# Patient Record
Sex: Male | Born: 1960 | Race: Black or African American | Hispanic: No | Marital: Married | Smoking: Never smoker
Health system: Southern US, Community
[De-identification: ages and names within clinical notes are randomized; demographics above are authoritative.]

## PROBLEM LIST (undated history)

## (undated) HISTORY — PX: TONSILLECTOMY: SUR1361

---

## 1998-12-23 ENCOUNTER — Ambulatory Visit: Admission: RE | Admit: 1998-12-23 | Discharge: 1998-12-23 | Payer: Self-pay | Admitting: Unknown Physician Specialty

## 2009-05-01 ENCOUNTER — Emergency Department (HOSPITAL_BASED_OUTPATIENT_CLINIC_OR_DEPARTMENT_OTHER): Admission: EM | Admit: 2009-05-01 | Discharge: 2009-05-01 | Payer: Self-pay | Admitting: Emergency Medicine

## 2017-06-23 ENCOUNTER — Encounter (HOSPITAL_BASED_OUTPATIENT_CLINIC_OR_DEPARTMENT_OTHER): Payer: Self-pay

## 2017-06-23 ENCOUNTER — Emergency Department (HOSPITAL_BASED_OUTPATIENT_CLINIC_OR_DEPARTMENT_OTHER): Payer: Worker's Compensation

## 2017-06-23 DIAGNOSIS — M25562 Pain in left knee: Secondary | ICD-10-CM | POA: Diagnosis present

## 2017-06-23 DIAGNOSIS — M79662 Pain in left lower leg: Secondary | ICD-10-CM | POA: Diagnosis not present

## 2017-06-23 NOTE — ED Notes (Signed)
Pt unsure if needs post accident [facility] UDS-notified he needed to get in touch with supervisor and let staff know

## 2017-06-23 NOTE — ED Triage Notes (Signed)
Pt states that a forced car door closed on left LE aprrox 930p at work-pt with ice packs and ace wrap in place upon arrivla-removed-slight abrasion and swelling noted to tib/fib area-pt NAD

## 2017-06-24 ENCOUNTER — Emergency Department (HOSPITAL_BASED_OUTPATIENT_CLINIC_OR_DEPARTMENT_OTHER)
Admission: EM | Admit: 2017-06-24 | Discharge: 2017-06-24 | Disposition: A | Discharge status: Home / Self Care | Payer: Worker's Compensation | Attending: Emergency Medicine | Admitting: Emergency Medicine

## 2017-06-24 DIAGNOSIS — M79662 Pain in left lower leg: Secondary | ICD-10-CM

## 2017-06-24 DIAGNOSIS — M25562 Pain in left knee: Secondary | ICD-10-CM

## 2017-06-24 MED ORDER — HYDROCODONE-ACETAMINOPHEN 5-325 MG PO TABS: 1.0000 | ORAL_TABLET | Freq: Four times a day (QID) | ORAL | 0 refills | Status: DC | PRN

## 2017-06-24 MED ORDER — IBUPROFEN 800 MG PO TABS: 800.0000 mg | ORAL_TABLET | Freq: Three times a day (TID) | ORAL | 0 refills | Status: DC

## 2017-06-24 NOTE — ED Provider Notes (Signed)
[facility] Provider Note   CSN: [id number] Arrival date & time: 06/23/17  2215     History   Chief Complaint Chief Complaint  Patient presents with  . Leg Injury    HPI Chad Cohen is a 56 y.o. male presenting with L leg pain.  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 He was unable to pull his leg out from between the truck and the door. He needed his coworkers help to get out. He states he is unable to walk due to the pain in his leg. Pain is in his knee medial and laterally, upper medial calf and lower outer leg. He has no pain in his hip, foot, or ankle. pain is worse with palpation and movement. Nothing makes it better. He denies previous injury to this leg. He denies numbness or tingling of his foot. He is not on blood thinners. He has not taken anything for pain.   Pt is unsure if he need UDS for paperwork/documentation for worker's comp.   HPI  History reviewed. No pertinent past medical history.  There are no active problems to display for this patient.   Past Surgical History:  Procedure Laterality Date  . TONSILLECTOMY         Home Medications    Prior to Admission medications   Medication Sig Start Date End Date Taking? Authorizing Provider  HYDROcodone-acetaminophen (NORCO/VICODIN) 5-325 MG tablet Take 1 tablet by mouth every 6 (six) hours as needed for severe pain. 06/24/17   Babara Buffalo, PA-C  ibuprofen (ADVIL,MOTRIN) 800 MG tablet Take 1 tablet (800 mg total) by mouth 3 (three) times daily. 06/24/17   Torsten Weniger, PA-C    Family History No family history on file.  Social History Social History  Substance Use Topics  . Smoking status: Never Smoker  . Smokeless tobacco: Never Used  . Alcohol use No     Allergies   Iodine   Review of Systems Review of Systems  Musculoskeletal: Positive for arthralgias, joint swelling and myalgias.  Neurological: Negative for  numbness.  Hematological: Does not bruise/bleed easily.     Physical Exam Updated Vital Signs BP 121/68 (BP Location: Right Arm)   Pulse 77   Temp 98.1 F (36.7 C) (Oral)   Resp 18   Ht 5\' 8"  (1.727 m)   Wt 118 kg (260 lb 2.3 oz)   SpO2 98%   BMI 39.55 kg/m   Physical Exam  Constitutional: He is oriented to person, place, and time. He appears well-developed and well-nourished. No distress.  HENT:  Head: Normocephalic and atraumatic.  Eyes: EOM are normal.  Neck: Normal range of motion.  Pulmonary/Chest: Effort normal.  Abdominal: He exhibits no distension.  Musculoskeletal: Normal range of motion.  Moderate swelling of L knee. No obvious contusions or laceration of knee. TTP of bilateral knee. No TTP posteriorly. Pt will not let me range the knee due to pain.  TTP of medial upper calf. No obvious laceration. Some small contusion noted. TTP of lower lateral leg with minor abrasion. No active bleeding. Minimal contusion in the area. Compartments of lower leg soft.  Pedal pulses intact bilaterally. Sensation intact bilaterally. Full active ROM of ankle and toes with minimal pain. Color and warmth of feet equal bilaterally.   Neurological: He is alert and oriented to person, place, and time.  Skin: Skin is warm. No rash noted.  Psychiatric: He has  a normal mood and affect.  Nursing note and vitals reviewed.    ED Treatments / Results  Labs (all labs ordered are listed, but only abnormal results are displayed) Labs Reviewed - No data to display  EKG  EKG Interpretation None       Radiology Dg Tibia/fibula Left  Result Date: 06/23/2017 CLINICAL DATA:  Left leg slammed in truck door. EXAM: LEFT TIBIA AND FIBULA - 2 VIEW COMPARISON:  None. FINDINGS: There is no evidence of fracture or other focal bone lesions. Soft tissues are unremarkable. IMPRESSION: No osseous injury of the left tibia or fibula. Electronically Signed   By: Deatra Robinson M.D.   On: 06/23/2017 23:00     Procedures Procedures (including critical care time)  Medications Ordered in ED Medications - No data to display   Initial Impression / Assessment and Plan / ED Course  I have reviewed the triage vital signs and the nursing notes.  Pertinent labs & imaging results that were available during my care of the patient were reviewed by me and considered in my medical decision making (see chart for details).     Pt presenting with pain of L knee and lower leg after it was caught in a truck door. Physical exam shows signs of muscle bruising and knee injury. Xray shows no signs of fx or dislocation. Likely muscle/ligament injury. Pt neurovascularly intact with soft compartments. Discussed findings with pt. Will dc with knee immobilizer, crutches, and pain medication. Pt to f/u with ortho. Pt appears safe for dc at this time. Strict return precautions given. Pt states he understands and agrees to plan.   Final Clinical Impressions(s) / ED Diagnoses   Final diagnoses:  Pain of left lower leg  Acute pain of left knee    New Prescriptions Discharge Medication List as of 06/24/2017  1:06 AM    START taking these medications   Details  HYDROcodone-acetaminophen (NORCO/VICODIN) 5-325 MG tablet Take 1 tablet by mouth every 6 (six) hours as needed for severe pain., Starting Thu 06/24/2017, Print    ibuprofen (ADVIL,MOTRIN) 800 MG tablet Take 1 tablet (800 mg total) by mouth 3 (three) times daily., Starting Thu 06/24/2017, Print         Brockway, Keilyn Nadal, PA-C 06/24/17 0330    Molpus, Jonny Ruiz, MD 06/24/17 [id number]

## 2017-06-24 NOTE — Discharge Instructions (Signed)
Take ibuprofen 3 times a day. You may take Norco as needed for breakthrough pain. Use the knee immobilizer and crutches while walking. It is important that you follow up with orthopedic doctor for further evaluation of your knee and leg. Return to the emergency room if you develop significantly worsening pain, hardness of your leg, change in color of your foot, or any new or worsening symptoms.

## 2017-07-09 ENCOUNTER — Telehealth: Payer: Self-pay | Admitting: *Deleted

## 2017-07-09 NOTE — Telephone Encounter (Signed)
Received request for Medical Records for Workman's Comp claim, non-established, seen in ED 06/23/17; forwarded to [place] for email/scan/SLS

## 2018-05-17 ENCOUNTER — Emergency Department (HOSPITAL_BASED_OUTPATIENT_CLINIC_OR_DEPARTMENT_OTHER): Payer: Self-pay

## 2018-05-17 ENCOUNTER — Other Ambulatory Visit: Payer: Self-pay

## 2018-05-17 ENCOUNTER — Emergency Department (HOSPITAL_BASED_OUTPATIENT_CLINIC_OR_DEPARTMENT_OTHER)
Admission: EM | Admit: 2018-05-17 | Discharge: 2018-05-18 | Disposition: A | Discharge status: Home / Self Care | Payer: Self-pay | Attending: Emergency Medicine | Admitting: Emergency Medicine

## 2018-05-17 ENCOUNTER — Encounter (HOSPITAL_BASED_OUTPATIENT_CLINIC_OR_DEPARTMENT_OTHER): Payer: Self-pay | Admitting: *Deleted

## 2018-05-17 DIAGNOSIS — E860 Dehydration: Secondary | ICD-10-CM | POA: Insufficient documentation

## 2018-05-17 DIAGNOSIS — K760 Fatty (change of) liver, not elsewhere classified: Secondary | ICD-10-CM | POA: Insufficient documentation

## 2018-05-17 DIAGNOSIS — R1013 Epigastric pain: Secondary | ICD-10-CM | POA: Insufficient documentation

## 2018-05-17 DIAGNOSIS — R3915 Urgency of urination: Secondary | ICD-10-CM | POA: Insufficient documentation

## 2018-05-17 DIAGNOSIS — K5900 Constipation, unspecified: Secondary | ICD-10-CM | POA: Insufficient documentation

## 2018-05-17 LAB — COMPREHENSIVE METABOLIC PANEL
ALK PHOS: 64 U/L (ref 38–126)
ALT: 35 U/L (ref 0–44)
AST: 29 U/L (ref 15–41)
Albumin: 3.6 g/dL (ref 3.5–5.0)
Anion gap: 8 (ref 5–15)
BUN: 13 mg/dL (ref 6–20)
CALCIUM: 8.8 mg/dL — AB (ref 8.9–10.3)
CO2: 26 mmol/L (ref 22–32)
CREATININE: 1.06 mg/dL (ref 0.61–1.24)
Chloride: 106 mmol/L (ref 98–111)
GFR calc Af Amer: >60 mL/min (ref >60)
GFR calc non Af Amer: >60 mL/min (ref >60)
Glucose, Bld: 97 mg/dL (ref 70–99)
Potassium: 3.9 mmol/L (ref 3.5–5.1)
Sodium: 140 mmol/L (ref 135–145)
Total Bilirubin: 0.7 mg/dL (ref 0.3–1.2)
Total Protein: 7 g/dL (ref 6.5–8.1)

## 2018-05-17 LAB — CBC WITH DIFFERENTIAL/PLATELET
BASOS ABS: 0 10*3/uL (ref 0.0–0.1)
Basophils Relative: 0 %
EOS PCT: 2 %
Eosinophils Absolute: 0.1 10*3/uL (ref 0.0–0.7)
HCT: 42.6 % (ref 39.0–52.0)
HEMOGLOBIN: 14.2 g/dL (ref 13.0–17.0)
LYMPHS ABS: 2.8 10*3/uL (ref 0.7–4.0)
Lymphocytes Relative: 44 %
MCH: 28.3 pg (ref 26.0–34.0)
MCHC: 33.3 g/dL (ref 30.0–36.0)
MCV: 85 fL (ref 78.0–100.0)
Monocytes Absolute: 0.7 10*3/uL (ref 0.1–1.0)
Monocytes Relative: 11 %
NEUTROS PCT: 43 %
Neutro Abs: 2.7 10*3/uL (ref 1.7–7.7)
PLATELETS: 249 10*3/uL (ref 150–400)
RBC: 5.01 MIL/uL (ref 4.22–5.81)
RDW: 14.7 % (ref 11.5–15.5)
WBC: 6.3 10*3/uL (ref 4.0–10.5)

## 2018-05-17 LAB — AMMONIA: Ammonia: 29 umol/L (ref 9–35)

## 2018-05-17 LAB — LIPASE, BLOOD: LIPASE: 41 U/L (ref 11–51)

## 2018-05-17 MED ORDER — ONDANSETRON HCL 4 MG/2ML IJ SOLN
4.0000 mg | Freq: Once | INTRAMUSCULAR | Status: AC
  Administered 2018-05-17: 4 mg via INTRAVENOUS
  Filled 2018-05-17: qty 2

## 2018-05-17 MED ORDER — MORPHINE SULFATE (PF) 4 MG/ML IV SOLN
4.0000 mg | Freq: Once | INTRAVENOUS | Status: AC
  Administered 2018-05-17: 4 mg via INTRAVENOUS
  Filled 2018-05-17: qty 1

## 2018-05-17 MED ORDER — SODIUM CHLORIDE 0.9 % IV BOLUS
1000.0000 mL | Freq: Once | INTRAVENOUS | Status: AC
  Administered 2018-05-17: 1000 mL via INTRAVENOUS

## 2018-05-17 MED ORDER — SODIUM CHLORIDE 0.9 % IV BOLUS
500.0000 mL | Freq: Once | INTRAVENOUS | Status: AC
  Administered 2018-05-17: 500 mL via INTRAVENOUS

## 2018-05-17 MED ORDER — POLYETHYLENE GLYCOL 3350 17 G PO PACK: 17.0000 g | PACK | Freq: Every day | ORAL | 0 refills | Status: DC

## 2018-05-17 NOTE — ED Triage Notes (Signed)
Right upper quadrant pain x 5 days. Pain is off and on. States he has been under a lot of stress. Headache.

## 2018-05-17 NOTE — Discharge Instructions (Addendum)
Your work-up today showed hepatic steatosis (fatty liver) but otherwise did not show evidence of obstruction or other concerning problem in your abdomen aside from the known constipation.  Please use the MiraLAX daily to help treat your constipation.  Please maintain hydration, and increase your fluid intake.  We suspect you are slightly dehydrated.  Please follow-up with a primary care physician for further management.  If any symptoms change or worsen, please return to the nearest emergency department.

## 2018-05-17 NOTE — ED Provider Notes (Signed)
[facility] EMERGENCY DEPARTMENT Provider Note   CSN: [id number] Arrival date & time: 05/17/18  2045     History   Chief Complaint Chief Complaint  Patient presents with  . Abdominal Pain    HPI Chad Cohen is a 57 y.o. male.  The history is provided by the patient, the spouse and medical records.  Abdominal Pain   This is a new problem. The current episode started more than 2 days ago. The problem occurs daily. The problem has not changed since onset.The pain is associated with eating. The pain is located in the RUQ and epigastric region. The quality of the pain is aching, cramping and sharp. The pain is at a severity of 8/10. The pain is moderate. Associated symptoms include nausea and constipation. Pertinent negatives include fever, diarrhea, vomiting, dysuria, frequency, hematuria and headaches. The symptoms are aggravated by palpation and eating. Nothing relieves the symptoms. Past workup does not include GI consult or surgery.    History reviewed. No pertinent past medical history.  There are no active problems to display for this patient.   Past Surgical History:  Procedure Laterality Date  . TONSILLECTOMY          Home Medications    Prior to Admission medications   Medication Sig Start Date End Date Taking? Authorizing Provider  HYDROcodone-acetaminophen (NORCO/VICODIN) 5-325 MG tablet Take 1 tablet by mouth every 6 (six) hours as needed for severe pain. 06/24/17   Caccavale, Sophia, PA-C  ibuprofen (ADVIL,MOTRIN) 800 MG tablet Take 1 tablet (800 mg total) by mouth 3 (three) times daily. 06/24/17   Caccavale, Sophia, PA-C    Family History No family history on file.  Social History Social History   Tobacco Use  . Smoking status: Never Smoker  . Smokeless tobacco: Never Used  Substance Use Topics  . Alcohol use: No  . Drug use: No     Allergies   Iodine   Review of Systems Review of Systems  Constitutional: Positive for fatigue.  Negative for chills, diaphoresis and fever.  HENT: Negative for congestion.   Respiratory: Negative for cough, chest tightness, shortness of breath and wheezing.   Cardiovascular: Negative for chest pain, palpitations and leg swelling.  Gastrointestinal: Positive for abdominal distention, abdominal pain, constipation and nausea. Negative for blood in stool, diarrhea and vomiting.  Genitourinary: Positive for urgency. Negative for decreased urine volume, dysuria, flank pain, frequency, hematuria, penile pain, penile swelling, scrotal swelling and testicular pain.  Musculoskeletal: Negative for back pain, neck pain and neck stiffness.  Skin: Negative for rash and wound.  Neurological: Positive for light-headedness. Negative for weakness, numbness and headaches.  Psychiatric/Behavioral: Negative for agitation.     Physical Exam Updated Vital Signs BP 125/86   Pulse 75   Temp 98.4 F (36.9 C) (Oral)   Resp 18   Ht 5' 8.5" (1.74 m)   Wt 117.9 kg (260 lb)   SpO2 98%   BMI 38.96 kg/m   Physical Exam  Constitutional: He is oriented to person, place, and time. He appears well-developed and well-nourished.  Non-toxic appearance. He does not appear ill. No distress.  HENT:  Head: Normocephalic and atraumatic.  Mouth/Throat: Oropharynx is clear and moist. No oropharyngeal exudate.  Eyes: Pupils are equal, round, and reactive to light. Conjunctivae and EOM are normal. No scleral icterus.  Neck: Neck supple.  Cardiovascular: Normal rate and regular rhythm.  No murmur heard. Pulmonary/Chest: Effort normal and breath sounds normal. No respiratory distress.  Abdominal: Soft.  Normal appearance and bowel sounds are normal. He exhibits no distension. There is tenderness in the right upper quadrant and epigastric area. There is no rigidity, no rebound, no guarding and no CVA tenderness.  Musculoskeletal: He exhibits no edema.  Neurological: He is alert and oriented to person, place, and time. He is  not disoriented. He displays no tremor. A sensory deficit (L leg numbness at baseline) is present. No cranial nerve deficit. He exhibits normal muscle tone. Coordination normal. GCS eye subscore is 4. GCS verbal subscore is 5. GCS motor subscore is 6.  Skin: Skin is warm and dry. Capillary refill takes less than 2 seconds. He is not diaphoretic. No erythema. No pallor.  Psychiatric: He has a normal mood and affect.  Nursing note and vitals reviewed.    ED Treatments / Results  Labs (all labs ordered are listed, but only abnormal results are displayed) Labs Reviewed  COMPREHENSIVE METABOLIC PANEL - Abnormal; Notable for the following components:      Result Value   Calcium 8.8 (*)    All other components within normal limits  URINE CULTURE  CBC WITH DIFFERENTIAL/PLATELET  LIPASE, BLOOD  AMMONIA  URINALYSIS, ROUTINE W REFLEX MICROSCOPIC    EKG None  Radiology Ct Abdomen Pelvis Wo Contrast  Result Date: 05/17/2018 CLINICAL DATA:  Periodic right upper quadrant abdominal pain over the last 5 days. Constipation and distention. EXAM: CT ABDOMEN AND PELVIS WITHOUT CONTRAST TECHNIQUE: Multidetector CT imaging of the abdomen and pelvis was performed following the standard protocol without IV contrast. COMPARISON:  None. FINDINGS: Lower chest: Unremarkable Hepatobiliary: Diffuse hepatic steatosis.  Gallbladder unremarkable. Pancreas: Unremarkable Spleen: Unremarkable Adrenals/Urinary Tract: Unremarkable Stomach/Bowel: Food material and contrast in the stomach. Appendix normal. Stool burden within normal limits. No dilated bowel. Vascular/Lymphatic: Unremarkable Reproductive: Unremarkable Other: No supplemental non-categorized findings. Musculoskeletal: Unremarkable IMPRESSION: 1. Diffuse hepatic steatosis. 2. Otherwise unremarkable. Electronically Signed   By: Gaylyn Rong M.D.   On: 05/17/2018 23:20    Procedures Procedures (including critical care time)  Medications Ordered in  ED Medications  sodium chloride 0.9 % bolus 1,000 mL (1,000 mLs Intravenous New Bag/Given 05/17/18 2343)  morphine 4 MG/ML injection 4 mg (4 mg Intravenous Given 05/17/18 2202)  ondansetron (ZOFRAN) injection 4 mg (4 mg Intravenous Given 05/17/18 2202)  sodium chloride 0.9 % bolus 500 mL (0 mLs Intravenous Stopped 05/17/18 2343)     Initial Impression / Assessment and Plan / ED Course  I have reviewed the triage vital signs and the nursing notes.  Pertinent labs & imaging results that were available during my care of the patient were reviewed by me and considered in my medical decision making (see chart for details).    Chad Cohen is a 57 y.o. male with a past medical history significant for prior tonsillectomy and prior left leg injury with chronic numbness who presents with fatigue, abdominal discomfort, constipation, nausea, and diffuse myalgias.  Patient reports that his symptoms have been ongoing for the last 5 days.  He reports the pain comes and goes and it is in the right upper quadrant primarily.  He describes it is cramping, sharp, and associated with distention.  He says that he had a bowel movement that was very small amount this morning and constipated.  He denies rectal bleeding.  He reports nausea but no vomiting.  He reports fatigue and has not been eating or drinking as much.  He reports his pain gets up to an 8 out of 10 severity and  last several minutes at a time.  He denies history of gallbladder disease or prior abdominal surgeries.  He does report increase in stress.  He denies any recent trauma, fevers, or chills.  He denies any new medication use.  He does report some urinary urgency but denies dysuria.  On exam, abdomen is tender in the right upper quadrant and the epigastrium.  No tenderness in the lower abdomen.  No flank tenderness or back tenderness.  Lungs clear chest is nontender.  Patient has some numbness and parts of his left leg which she reports is unchanged  from baseline.  No other focal neurologic abnormality seen.  Clinically I suspect patient has constipation causing his symptoms however with the right upper quadrant pain it, will obtain imaging to further evaluate for gallbladder disease.  With the distention and constipation, will order CT scan with oral contrast.  Patient given pain medicine, nausea medicine, and fluids initially.  Patient will have laboratory testing as well.  Patient reported feeling better after medications.  CT scan showed hepatic steatosis but otherwise reassuring.  No evidence of obstruction, diverticulitis or appendicitis.  No cholecystitis seen.  Laboratory testing was reassuring.  Patient is awaiting urinalysis however if it is reassuring I suspect patient will be stable for discharge home for further management of dehydration and likely constipation as the cause of symptoms.  Anticipate discharge with MiraLAX prescription.  12:15 AM Patient dropped his urine sample into the toilet.  Patient be given more fluids and urine will be collected again.  Care transferred to Dr. Read DriversMolpus while awaiting urinalysis results.  If urinalysis is positive, will treat for UTI.  If it is negative, patient was sent home with prescription for MiraLAX for his constipation.  Patient will be encouraged to stay hydrated and follow-up with PCP.  Final Clinical Impressions(s) / ED Diagnoses   Final diagnoses:  Epigastric pain  Constipation, unspecified constipation type  Dehydration  Urinary urgency  Hepatic steatosis    ED Discharge Orders        Ordered    polyethylene glycol (MIRALAX) packet  Daily     05/17/18 2359      Clinical Impression: 1. Epigastric pain   2. Constipation, unspecified constipation type   3. Dehydration   4. Urinary urgency   5. Hepatic steatosis     Disposition: Care transferred to Dr. Read DriversMolpus for reassessment of patient after urinalysis.  Anticipate discharge for outpatient management of constipation as  likely etiology of her abdominal pain.  This note was prepared with assistance of Conservation officer, historic buildingsDragon voice recognition software. Occasional wrong-word or sound-a-like substitutions may have occurred due to the inherent limitations of voice recognition software.     Tegeler, Canary Brimhristopher J, MD 05/18/18 1051

## 2018-05-18 LAB — URINALYSIS, ROUTINE W REFLEX MICROSCOPIC
BILIRUBIN URINE: NEGATIVE
Glucose, UA: NEGATIVE mg/dL
Ketones, ur: NEGATIVE mg/dL
LEUKOCYTES UA: NEGATIVE
NITRITE: NEGATIVE
Protein, ur: NEGATIVE mg/dL
Specific Gravity, Urine: 1.02 (ref 1.005–1.030)
pH: 5.5 (ref 5.0–8.0)

## 2018-05-18 LAB — URINALYSIS, MICROSCOPIC (REFLEX)

## 2018-05-19 LAB — URINE CULTURE: Culture: NO GROWTH

## 2018-05-21 IMAGING — DX DG TIBIA/FIBULA 2V*L*
4 series · 4 of 4 positions shown · non-contrast
Comparison: None.

CLINICAL DATA: Left leg slammed in truck door.

EXAM:
LEFT TIBIA AND FIBULA - 2 VIEW

[tibia ap (1 of 2)]
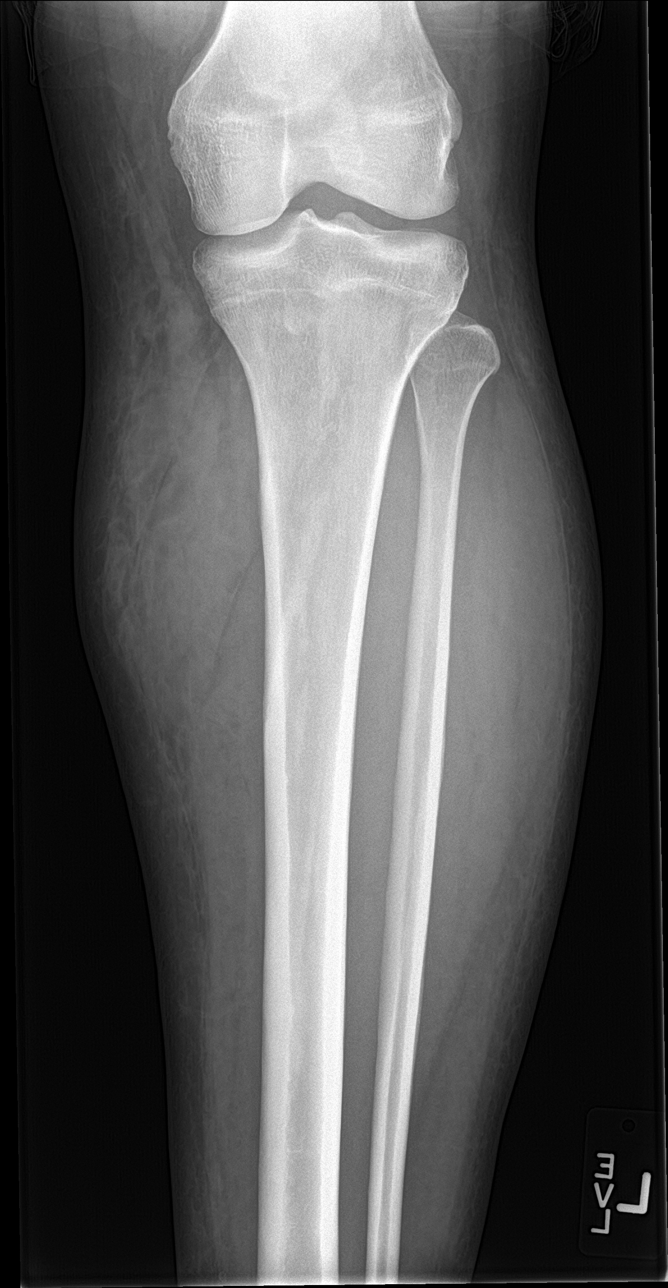

[tibia ap (2 of 2)]
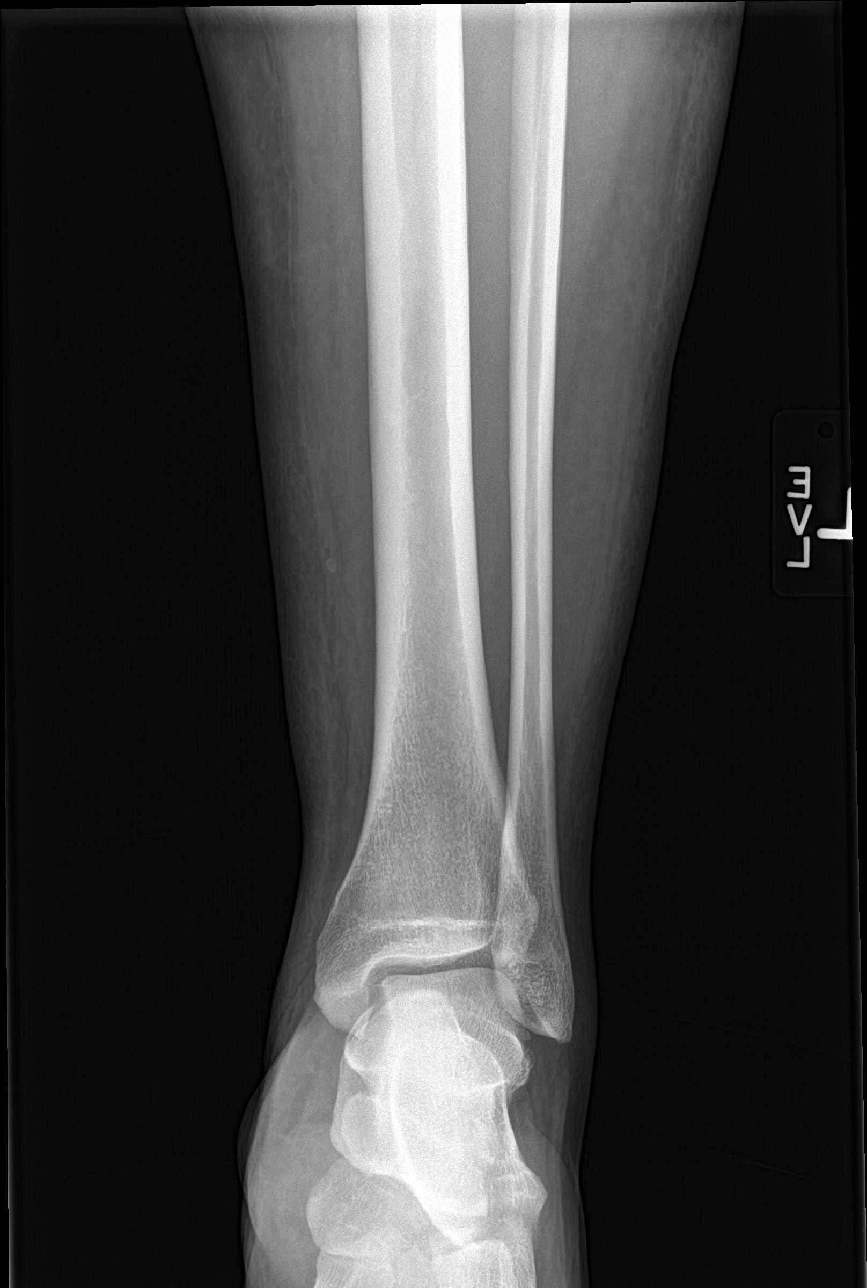

[tibia lat (1 of 2)]
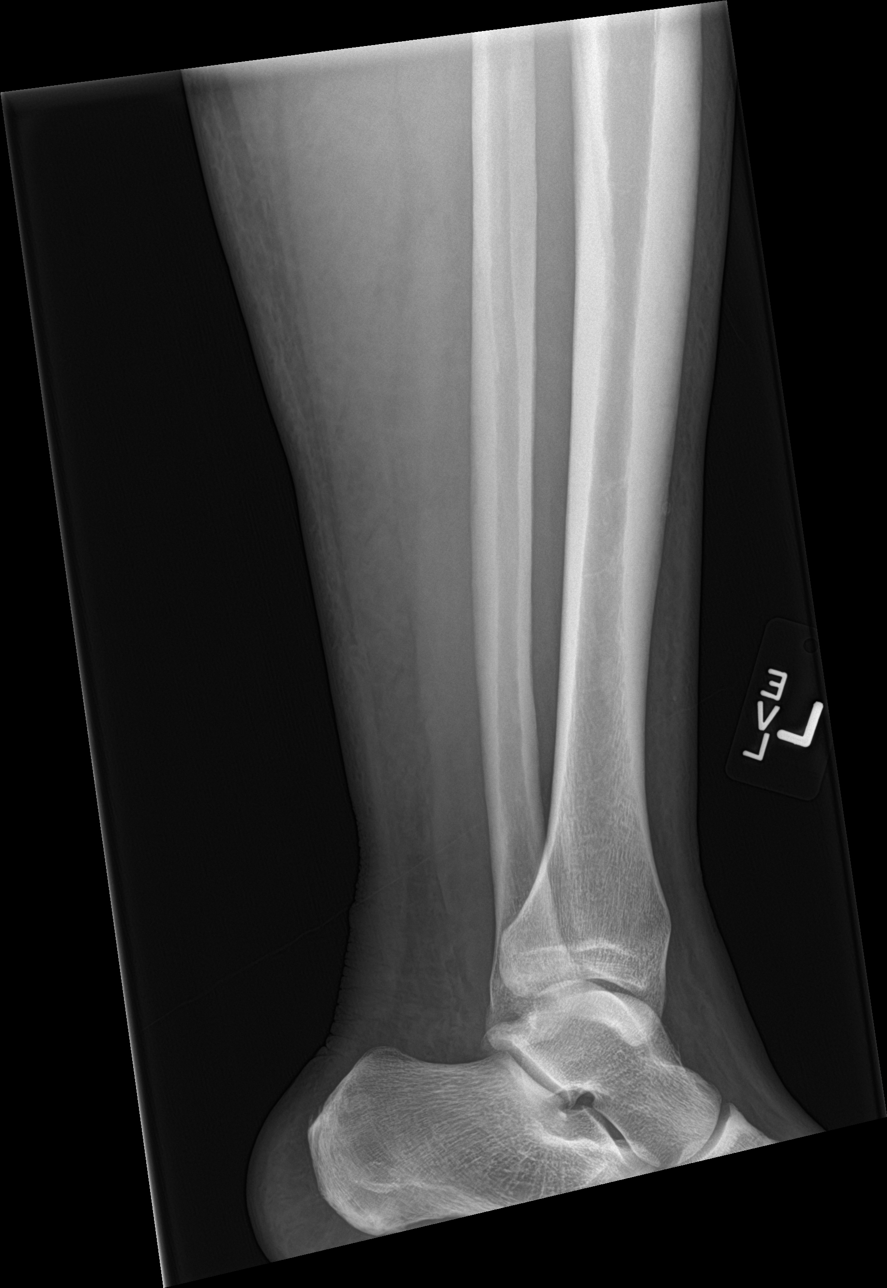

[tibia lat (2 of 2)]
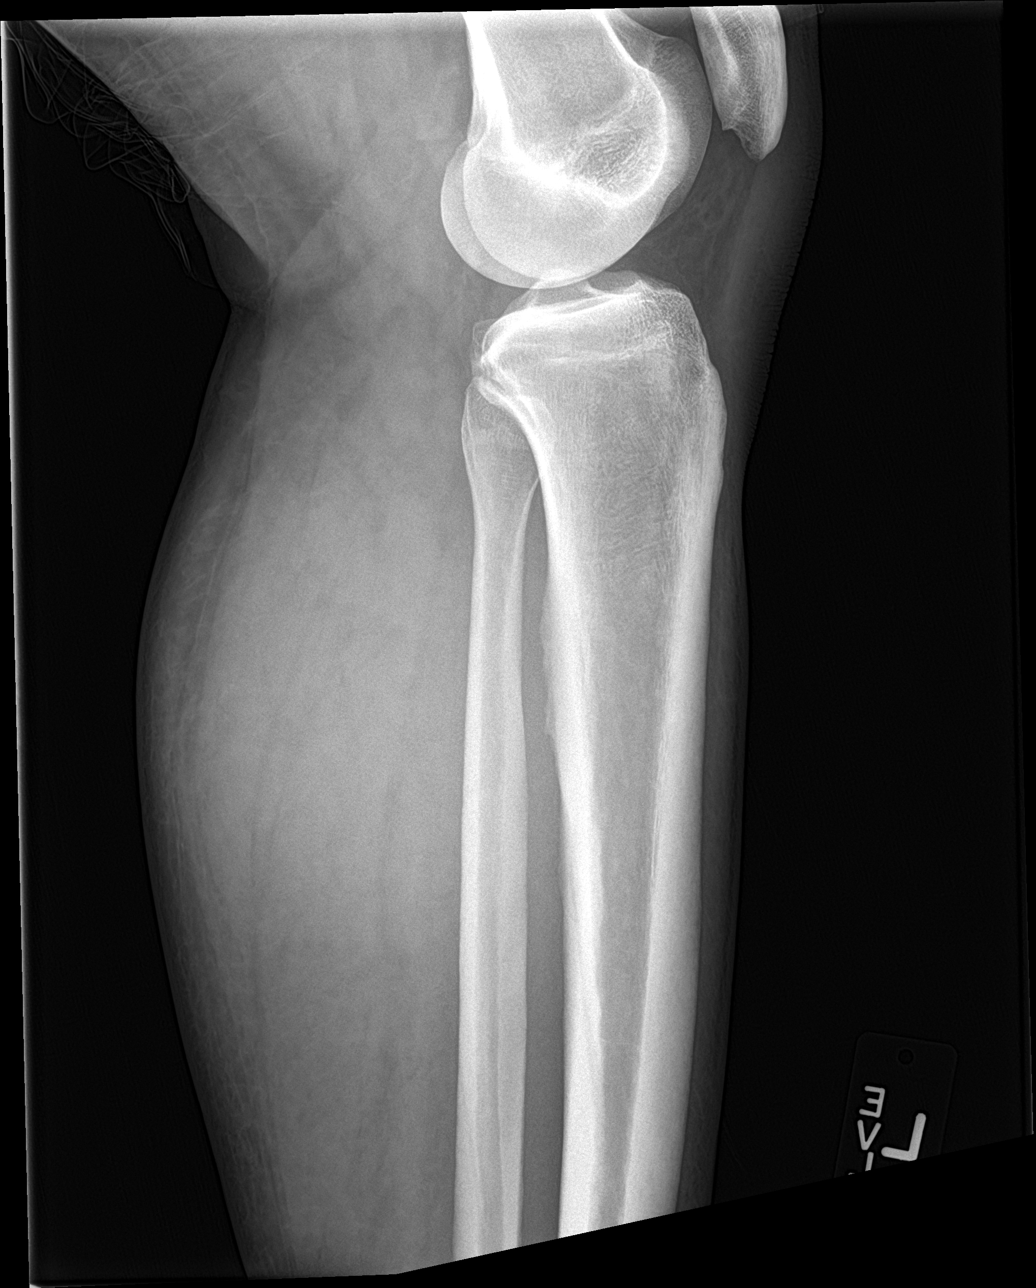

[4 of 4 positions shown; findings below may reference images not displayed]

FINDINGS: There is no evidence of fracture or other focal bone lesions. Soft
tissues are unremarkable.
IMPRESSION: No osseous injury of the left tibia or fibula.

## 2018-06-08 ENCOUNTER — Emergency Department (HOSPITAL_BASED_OUTPATIENT_CLINIC_OR_DEPARTMENT_OTHER)
Admission: EM | Admit: 2018-06-08 | Discharge: 2018-06-08 | Disposition: A | Discharge status: Home / Self Care | Payer: BLUE CROSS/BLUE SHIELD | Attending: Emergency Medicine | Admitting: Emergency Medicine

## 2018-06-08 ENCOUNTER — Encounter (HOSPITAL_BASED_OUTPATIENT_CLINIC_OR_DEPARTMENT_OTHER): Payer: Self-pay

## 2018-06-08 ENCOUNTER — Other Ambulatory Visit: Payer: Self-pay

## 2018-06-08 DIAGNOSIS — L989 Disorder of the skin and subcutaneous tissue, unspecified: Secondary | ICD-10-CM

## 2018-06-08 MED ORDER — CEPHALEXIN 250 MG PO CAPS: 500.0000 mg | ORAL_CAPSULE | Freq: Two times a day (BID) | ORAL | 0 refills | Status: AC

## 2018-06-08 NOTE — ED Triage Notes (Signed)
C/o tick to left LE-first noticed yesterday-NAD-steady gait

## 2018-06-08 NOTE — ED Notes (Signed)
Pt fully redressed prior to nurse assessment, pt now up for d/c. See EDP note. Pt c/o tick bite to LLE. Unable to visualize. Pt denies fevers/pain on assessment.

## 2018-06-08 NOTE — ED Provider Notes (Signed)
[facility] EMERGENCY DEPARTMENT Provider Note   CSN: [id number] Arrival date & time: 06/08/18  2038     History   Chief Complaint Chief Complaint  Patient presents with  . Tick Removal    HPI Chad Cohen is a 57 y.o. male presenting for evaluation of L leg lesion.   Pt states that a few days ago he noticed a painful spot in the back of his left leg.  It initially was very red and swollen.  The past several days, the redness has improved, but reports he is still having a lot of pain.  He thinks he might of had a tick bite, although he never saw a tick.  He is concerned there is something in his leg.  He has no lesions elsewhere.  He denies fevers, chills, nausea, vomiting.  He has no medical problems, takes no medications daily.  He is immunocompetent.   HPI  History reviewed. No pertinent past medical history.  There are no active problems to display for this patient.   Past Surgical History:  Procedure Laterality Date  . TONSILLECTOMY          Home Medications    Prior to Admission medications   Medication Sig Start Date End Date Taking? Authorizing Provider  cephALEXin (KEFLEX) 250 MG capsule Take 2 capsules (500 mg total) by mouth 2 (two) times daily for 5 days. 06/08/18 06/13/18  Peg Fifer, PA-C  polyethylene glycol (MIRALAX) packet Take 17 g by mouth daily. 05/17/18   Tegeler, Canary Brim, MD    Family History No family history on file.  Social History Social History   Tobacco Use  . Smoking status: Never Smoker  . Smokeless tobacco: Never Used  Substance Use Topics  . Alcohol use: No  . Drug use: No     Allergies   Iodinated diagnostic agents and Iodine   Review of Systems Review of Systems  Constitutional: Negative for fever.  Skin: Positive for color change.     Physical Exam Updated Vital Signs BP 117/80 (BP Location: Right Arm)   Pulse 70   Temp 99.5 F (37.5 C) (Oral)   Resp 18   Ht 5\' 9"  (1.753 m)   Wt 127  kg (280 lb)   SpO2 98%   BMI 41.35 kg/m   Physical Exam  Constitutional: He is oriented to person, place, and time. He appears well-developed and well-nourished. No distress.  HENT:  Head: Normocephalic and atraumatic.  Eyes: EOM are normal.  Neck: Normal range of motion.  Pulmonary/Chest: Effort normal.  Abdominal: He exhibits no distension.  Musculoskeletal: Normal range of motion.  Neurological: He is alert and oriented to person, place, and time.  Skin: Skin is warm. No rash noted.  Small scab on posterior L leg with small underlying nodule. No active drainage. No surrounding redness or swelling. See picture below  Psychiatric: He has a normal mood and affect.  Nursing note and vitals reviewed.      ED Treatments / Results  Labs (all labs ordered are listed, but only abnormal results are displayed) Labs Reviewed - No data to display  EKG None  Radiology No results found.  Procedures Procedures (including critical care time)  Medications Ordered in ED Medications - No data to display   Initial Impression / Assessment and Plan / ED Course  I have reviewed the triage vital signs and the nursing notes.  Pertinent labs & imaging results that were available during my care of the  patient were reviewed by me and considered in my medical decision making (see chart for details).     Patient presenting for evaluation of lesion on the posterior left leg.  Physical exam reassuring, no signs of systemic infection or significant abscess.  Small scab with underlying nodule.  Fever inflammation over abscess, as it is not significantly tender and has been improving in terms of redness and swelling.  As pt reports worsening pain, will tx with short course of abx and scheduled antiinflammatories.  Patient to follow-up with PCP or in the ER as needed for recheck.  At this time, patient appears safe for discharge.  Return precautions given.  Patient states he understands and agrees to  plan.   Final Clinical Impressions(s) / ED Diagnoses   Final diagnoses:  Skin lesion of left leg    ED Discharge Orders        Ordered    cephALEXin (KEFLEX) 250 MG capsule  2 times daily     06/08/18 2203       Alveria ApleyCaccavale, Jyoti Harju, PA-C 06/08/18 2333    Maia PlanLong, Joshua G, MD 06/09/18 [phone]

## 2018-06-08 NOTE — Discharge Instructions (Signed)
Take antibiotics as prescribed.  Take the entire course, even if your symptoms improve. Take ibuprofen 3 times a day with meals.  Do not take other anti-inflammatories at the same time (Advil, Motrin, naproxen, Aleve). You may supplement with Tylenol if you need further pain control. Use warm compresses 3-4 times a day. Otherwise, try not to touch, irritate or pick at the area.  Return to the ER if you develop worsening pain, fevers, inability to bend your leg, increasing redness, or any new or concerning symptoms.

## 2024-11-22 ENCOUNTER — Emergency Department (HOSPITAL_COMMUNITY)

## 2024-11-22 ENCOUNTER — Emergency Department (HOSPITAL_COMMUNITY)
Admission: EM | Admit: 2024-11-22 | Discharge: 2024-11-22 | Disposition: A | Discharge status: Home / Self Care | Attending: Emergency Medicine | Admitting: Emergency Medicine

## 2024-11-22 ENCOUNTER — Other Ambulatory Visit: Payer: Self-pay

## 2024-11-22 DIAGNOSIS — K529 Noninfective gastroenteritis and colitis, unspecified: Secondary | ICD-10-CM | POA: Diagnosis not present

## 2024-11-22 DIAGNOSIS — R109 Unspecified abdominal pain: Secondary | ICD-10-CM | POA: Diagnosis present

## 2024-11-22 LAB — COMPREHENSIVE METABOLIC PANEL WITH GFR
ALT: 32 U/L (ref 0–44)
AST: 28 U/L (ref 15–41)
Albumin: 3.9 g/dL (ref 3.5–5.0)
Alkaline Phosphatase: 79 U/L (ref 38–126)
Anion gap: 10 (ref 5–15)
BUN: 9 mg/dL (ref 8–23)
CO2: 26 mmol/L (ref 22–32)
Calcium: 8.8 mg/dL — ABNORMAL LOW (ref 8.9–10.3)
Chloride: 103 mmol/L (ref 98–111)
Creatinine, Ser: 1.13 mg/dL (ref 0.61–1.24)
GFR, Estimated: >60 mL/min
Glucose, Bld: 97 mg/dL (ref 70–99)
Potassium: 4.3 mmol/L (ref 3.5–5.1)
Sodium: 138 mmol/L (ref 135–145)
Total Bilirubin: 1 mg/dL (ref 0.0–1.2)
Total Protein: 7.5 g/dL (ref 6.5–8.1)

## 2024-11-22 LAB — CBC
HCT: 47.1 % (ref 39.0–52.0)
Hemoglobin: 15.4 g/dL (ref 13.0–17.0)
MCH: 28.6 pg (ref 26.0–34.0)
MCHC: 32.7 g/dL (ref 30.0–36.0)
MCV: 87.5 fL (ref 80.0–100.0)
Platelets: 258 K/uL (ref 150–400)
RBC: 5.38 MIL/uL (ref 4.22–5.81)
RDW: 13.6 % (ref 11.5–15.5)
WBC: 7 K/uL (ref 4.0–10.5)
nRBC: 0 % (ref 0.0–0.2)

## 2024-11-22 LAB — URINALYSIS, ROUTINE W REFLEX MICROSCOPIC
Bilirubin Urine: NEGATIVE
Glucose, UA: NEGATIVE mg/dL
Hgb urine dipstick: NEGATIVE
Ketones, ur: 5 mg/dL — AB
Leukocytes,Ua: NEGATIVE
Nitrite: NEGATIVE
Protein, ur: NEGATIVE mg/dL
Specific Gravity, Urine: 1.011 (ref 1.005–1.030)
pH: 7 (ref 5.0–8.0)

## 2024-11-22 LAB — LIPASE, BLOOD: Lipase: 28 U/L (ref 11–51)

## 2024-11-22 MED ORDER — ACETAMINOPHEN 500 MG PO TABS
1000.0000 mg | ORAL_TABLET | Freq: Once | ORAL | Status: AC
  Administered 2024-11-22: 1000 mg via ORAL
  Filled 2024-11-22: qty 2

## 2024-11-22 MED ORDER — SODIUM CHLORIDE 0.9 % IV BOLUS
1000.0000 mL | Freq: Once | INTRAVENOUS | Status: AC
  Administered 2024-11-22: 1000 mL via INTRAVENOUS

## 2024-11-22 MED ORDER — DICYCLOMINE HCL 20 MG PO TABS: 20.0000 mg | ORAL_TABLET | Freq: Two times a day (BID) | ORAL | 0 refills | Status: AC

## 2024-11-22 MED ORDER — ALUM & MAG HYDROXIDE-SIMETH 200-200-20 MG/5ML PO SUSP
30.0000 mL | Freq: Once | ORAL | Status: AC
  Administered 2024-11-22: 30 mL via ORAL
  Filled 2024-11-22: qty 30

## 2024-11-22 NOTE — ED Triage Notes (Signed)
 Pt. Stated, Jesus had middle abdominal pain since Friday. Denies any N/V/D. Its in the middle and I can pinpoint the actual area it hurts the worse.

## 2024-11-22 NOTE — Discharge Instructions (Addendum)
 Your CT scan showed evidence of enteritis.  This was located right where you have been feeling the pain.  This is likely viral.  It will resolve on its own.  The meantime he can take Tylenol , ibuprofen , and Bentyl  Harden have sent in for pain control.  You can take 1000 mg of Tylenol  every 6 hours.  Do not exceed 4000 mg in the span of 24 hours, take 600 mg of ibuprofen  every 6 hours.  Return for any emergent symptoms.  Establish a primary care doctor.

## 2024-11-22 NOTE — ED Notes (Signed)
 Patient transported to CT

## 2024-11-22 NOTE — ED Provider Notes (Signed)
 " [facility] Provider Note   CSN: [id number] Arrival date & time: 11/22/24  9060     Patient presents with: Abdominal Pain   Chad Cohen is a 64 y.o. male.   64 year old male presents today for concern of epigastric abdominal pain that has been ongoing since Friday.  It got worse yesterday.  He states he did a bowel cleanse with milk of magnesia on Friday and had significant bowel movement but still feels bloated.  He denies any chest pain, any blood in stool.  Has been taking herbal medicines.  Does not have a PCP.  He states few months back he noticed a worm/parasite when he urinated.  He was unsure if this came from him or from the toilet water being dirty.  He did a parasite cleanse afterwards which involved different herbal remedies.  The history is provided by the patient. No language interpreter was used.       Prior to Admission medications  Medication Sig Start Date End Date Taking? Authorizing Provider  dicyclomine  (BENTYL ) 20 MG tablet Take 1 tablet (20 mg total) by mouth 2 (two) times daily. 11/22/24  Yes Tiya Schrupp, PA-C    Allergies: Iodinated contrast media and Iodine    Review of Systems  Constitutional:  Negative for chills and fever.  Respiratory:  Negative for cough and shortness of breath.   Cardiovascular:  Negative for chest pain.  Gastrointestinal:  Positive for abdominal pain. Negative for nausea and vomiting.  Genitourinary:  Negative for dysuria.  Neurological:  Negative for light-headedness.  All other systems reviewed and are negative.   Updated Vital Signs BP (!) 118/52 (BP Location: Left Arm)   Pulse 65   Temp 98.1 F (36.7 C) (Oral)   Resp 16   Ht 5' 8 (1.727 m)   Wt 117.9 kg   SpO2 100%   BMI 39.53 kg/m   Physical Exam Vitals and nursing note reviewed.  Constitutional:      General: He is not in acute distress.    Appearance: Normal appearance. He is not ill-appearing.  HENT:      Head: Normocephalic and atraumatic.     Nose: Nose normal.  Eyes:     Conjunctiva/sclera: Conjunctivae normal.  Pulmonary:     Effort: Pulmonary effort is normal. No respiratory distress.  Abdominal:     General: There is no distension.     Tenderness: There is abdominal tenderness. There is no right CVA tenderness, left CVA tenderness, guarding or rebound.     Comments: Negative Rovsing's and obturator signs  Musculoskeletal:        General: No deformity.  Skin:    Findings: No rash.  Neurological:     Mental Status: He is alert.     (all labs ordered are listed, but only abnormal results are displayed) Labs Reviewed  COMPREHENSIVE METABOLIC PANEL WITH GFR - Abnormal; Notable for the following components:      Result Value   Calcium 8.8 (*)    All other components within normal limits  URINALYSIS, ROUTINE W REFLEX MICROSCOPIC - Abnormal; Notable for the following components:   Ketones, ur 5 (*)    All other components within normal limits  LIPASE, BLOOD  CBC    EKG: None  Radiology: CT ABDOMEN PELVIS WO CONTRAST Result Date: 11/22/2024 CLINICAL DATA:  Abdominal pain. EXAM: CT ABDOMEN AND PELVIS WITHOUT CONTRAST TECHNIQUE: Multidetector CT imaging of the abdomen and pelvis was performed following the  standard protocol without IV contrast. RADIATION DOSE REDUCTION: This exam was performed according to the departmental dose-optimization program which includes automated exposure control, adjustment of the mA and/or kV according to patient size and/or use of iterative reconstruction technique. COMPARISON:  05/17/2018. FINDINGS: Lower chest: No acute findings. Heart is at the upper limits of normal in size. No pericardial or pleural effusion. Distal esophagus is grossly unremarkable. Hepatobiliary: Liver is decreased in attenuation diffusely. Liver and gallbladder are otherwise grossly unremarkable. No biliary ductal dilatation. Pancreas: Negative. Spleen: Negative. Adrenals/Urinary  Tract: Adrenal glands and kidneys are unremarkable. No urinary stones. Ureters are decompressed. Bladder is relatively low in volume. Stomach/Bowel: Stomach, small bowel, appendix and colon are unremarkable. Vascular/Lymphatic: Vascular structures are unremarkable. No pathologically enlarged lymph nodes. Reproductive: Prostate is visualized. Other: Small right inguinal hernia contains fat. Supraumbilical midline omental haziness and stranding. Small umbilical hernia contains fat. No free fluid. Musculoskeletal: Degenerative changes in the spine. IMPRESSION: 1. Mild midline supraumbilical omental inflammatory stranding may be due to an infectious/inflammatory enteritis. 2. No additional findings to explain the patient's abdominal pain. 3. Hepatic steatosis. Electronically Signed   By: Newell Eke M.D.   On: 11/22/2024 11:00     Procedures   Medications Ordered in the ED  acetaminophen  (TYLENOL ) tablet 1,000 mg (1,000 mg Oral Given 11/22/24 1040)  sodium chloride  0.9 % bolus 1,000 mL (0 mLs Intravenous Stopped 11/22/24 1259)  alum & mag hydroxide-simeth (MAALOX/MYLANTA) 200-200-20 MG/5ML suspension 30 mL (30 mLs Oral Given 11/22/24 1040)                                    Medical Decision Making Amount and/or Complexity of Data Reviewed Labs: ordered. Radiology: ordered.  Risk OTC drugs. Prescription drug management.   Medical Decision Making / ED Course   This patient presents to the ED for concern of abdominal pain gastroenteritis, appendicitis, colitis, enteritis, this involves an extensive number of treatment options, and is a complaint that carries with it a high risk of complications and morbidity.  The differential diagnosis includes gastroenteritis, appendicitis, colitis, UTI, pyelonephritis, nephrolithiasis  MDM: 64 year old male presents with abdominal pain.  Ongoing since Friday. CBC unremarkable, CMP without acute concern.  Lipase within normal.  UA without evidence of UTI  with the exception of some ketones.  He did get some fluids in the emergency department.  CT abdomen pelvis without contrast shows evidence of enteritis but no other acute intra-abdominal process.  Supportive care discussed.  This is likely viral.  Discussed establishing care and following up with PCP clinic.  Return precautions given.  Patient discharged in stable condition.  Return precautions discussed.  Patient and wife voiced understanding and are in agreement with plan.  Patient states that he mainly focuses on natural remedies.  He does not have a PCP.  Advised him to establish a primary care doctor to occasionally be evaluated and have his screening test done.  He states he has been living with cancer for 20+ years [place]  He states he had a PCP here for a while but did not agree with her management so he has stopped going.  Advised him to the importance of establishing a PCP.  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   Additional history obtained: -Additional history obtained from wife at bedside -External records from  outside source obtained and reviewed including: Chart review including previous notes, labs, imaging, consultation notes   Lab Tests: -I ordered, reviewed, and interpreted labs.   The pertinent results include:   Labs Reviewed  COMPREHENSIVE METABOLIC PANEL WITH GFR - Abnormal; Notable for the following components:      Result Value   Calcium 8.8 (*)    All other components within normal limits  URINALYSIS, ROUTINE W REFLEX MICROSCOPIC - Abnormal; Notable for the following components:   Ketones, ur 5 (*)    All other components within normal limits  LIPASE, BLOOD  CBC      EKG  EKG Interpretation Date/Time:    Ventricular Rate:    PR Interval:    QRS Duration:    QT Interval:    QTC Calculation:   R Axis:      Text Interpretation:           Imaging  Studies ordered: I ordered imaging studies including CT abdomen pelvis without contrast I independently visualized and interpreted imaging. I agree with the radiologist interpretation   Medicines ordered and prescription drug management: Meds ordered this encounter  Medications   acetaminophen  (TYLENOL ) tablet 1,000 mg   sodium chloride  0.9 % bolus 1,000 mL   alum & mag hydroxide-simeth (MAALOX/MYLANTA) 200-200-20 MG/5ML suspension 30 mL   dicyclomine  (BENTYL ) 20 MG tablet    Sig: Take 1 tablet (20 mg total) by mouth 2 (two) times daily.    Dispense:  20 tablet    Refill:  0    Supervising Provider:   CLEOTILDE ROGUE [[id number]]    -I have reviewed the patients home medicines and have made adjustments as needed     Reevaluation: After the interventions noted above, I reevaluated the patient and found that they have :improved  Co morbidities that complicate the patient evaluation No past medical history on file.    Dispostion: Discharged in stable condition.  Return precaution discussed.  Patient voices understanding and is in agreement with plan.  Final diagnoses:  Abdominal pain, unspecified abdominal location  Enteritis    ED Discharge Orders          Ordered    dicyclomine  (BENTYL ) 20 MG tablet  2 times daily        11/22/24 1310               Hildegard Loge, PA-C 11/22/24 1316    Emil Share, DO 11/22/24 1340  "
